# Patient Record
Sex: Male | Born: 1990 | Race: Black or African American | Hispanic: No | Marital: Single | Smoking: Current every day smoker
Health system: Southern US, Community
[De-identification: ages and names within clinical notes are randomized; demographics above are authoritative.]

---

## 2015-08-11 ENCOUNTER — Encounter (HOSPITAL_BASED_OUTPATIENT_CLINIC_OR_DEPARTMENT_OTHER): Payer: Self-pay

## 2015-08-11 ENCOUNTER — Emergency Department (HOSPITAL_BASED_OUTPATIENT_CLINIC_OR_DEPARTMENT_OTHER): Payer: No Typology Code available for payment source

## 2015-08-11 ENCOUNTER — Emergency Department (HOSPITAL_BASED_OUTPATIENT_CLINIC_OR_DEPARTMENT_OTHER)
Admission: EM | Admit: 2015-08-11 | Discharge: 2015-08-11 | Disposition: A | Discharge status: Home / Self Care | Payer: No Typology Code available for payment source | Attending: Emergency Medicine | Admitting: Emergency Medicine

## 2015-08-11 DIAGNOSIS — S0990XA Unspecified injury of head, initial encounter: Secondary | ICD-10-CM | POA: Diagnosis present

## 2015-08-11 DIAGNOSIS — Y998 Other external cause status: Secondary | ICD-10-CM | POA: Diagnosis not present

## 2015-08-11 DIAGNOSIS — S80811A Abrasion, right lower leg, initial encounter: Secondary | ICD-10-CM

## 2015-08-11 DIAGNOSIS — S0091XA Abrasion of unspecified part of head, initial encounter: Secondary | ICD-10-CM

## 2015-08-11 DIAGNOSIS — S4992XA Unspecified injury of left shoulder and upper arm, initial encounter: Secondary | ICD-10-CM | POA: Diagnosis not present

## 2015-08-11 DIAGNOSIS — Y9389 Activity, other specified: Secondary | ICD-10-CM | POA: Diagnosis not present

## 2015-08-11 DIAGNOSIS — S199XXA Unspecified injury of neck, initial encounter: Secondary | ICD-10-CM | POA: Insufficient documentation

## 2015-08-11 DIAGNOSIS — S29002A Unspecified injury of muscle and tendon of back wall of thorax, initial encounter: Secondary | ICD-10-CM | POA: Diagnosis not present

## 2015-08-11 DIAGNOSIS — Z23 Encounter for immunization: Secondary | ICD-10-CM | POA: Diagnosis not present

## 2015-08-11 DIAGNOSIS — Y92410 Unspecified street and highway as the place of occurrence of the external cause: Secondary | ICD-10-CM | POA: Insufficient documentation

## 2015-08-11 DIAGNOSIS — S4991XA Unspecified injury of right shoulder and upper arm, initial encounter: Secondary | ICD-10-CM | POA: Diagnosis not present

## 2015-08-11 DIAGNOSIS — S0001XA Abrasion of scalp, initial encounter: Secondary | ICD-10-CM | POA: Insufficient documentation

## 2015-08-11 DIAGNOSIS — M542 Cervicalgia: Secondary | ICD-10-CM

## 2015-08-11 MED ORDER — OXYCODONE-ACETAMINOPHEN 5-325 MG PO TABS
2.0000 | ORAL_TABLET | Freq: Once | ORAL | Status: AC
  Administered 2015-08-11: 2 via ORAL
  Filled 2015-08-11: qty 2

## 2015-08-11 MED ORDER — TETANUS-DIPHTH-ACELL PERTUSSIS 5-2.5-18.5 LF-MCG/0.5 IM SUSP
0.5000 mL | Freq: Once | INTRAMUSCULAR | Status: AC
  Administered 2015-08-11: 0.5 mL via INTRAMUSCULAR
  Filled 2015-08-11: qty 0.5

## 2015-08-11 MED ORDER — ONDANSETRON 8 MG PO TBDP
8.0000 mg | ORAL_TABLET | Freq: Once | ORAL | Status: AC
  Administered 2015-08-11: 8 mg via ORAL
  Filled 2015-08-11: qty 1

## 2015-08-11 NOTE — ED Notes (Signed)
Patient stable and ambulatory.  Patient verbalizes understanding of discharge instructions. 
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
## 2015-08-11 NOTE — Discharge Instructions (Signed)
Abrasion An abrasion is a cut or scrape on the surface of your skin. An abrasion does not go through all of the layers of your skin. It is important to take good care of your abrasion to prevent infection. HOME CARE Medicines  Take or apply medicines only as told by your doctor.  If you were prescribed an antibiotic ointment, finish all of it even if you start to feel better. Wound Care  Clean the wound with mild soap and water 2-3 times per day or as told by your doctor. Pat your wound dry with a clean towel. Do not rub it.  There are many ways to close and cover a wound. Follow instructions from your doctor about:  How to take care of your wound.  When and how you should change your bandage (dressing).  When and how you should take off your dressing.  Check your wound every day for signs of infection. Watch for:  Redness, swelling, or pain.  Fluid, blood, or pus. General Instructions  Keep the dressing dry as told by your doctor. Do not take baths, swim, use a hot tub, or do anything that would put your wound underwater until your doctor says it is okay.  If there is swelling, raise (elevate) the injured area above the level of your heart while you are sitting or lying down.  Keep all follow-up visits as told by your doctor. This is important. GET HELP IF:  You were given a tetanus shot and you have any of these where the needle went in:  Swelling.  Very bad pain.  Redness.  Bleeding.  Medicine does not help your pain.  You have any of these at the site of the wound:  More redness.  More swelling.  More pain. GET HELP RIGHT AWAY IF:  You have a red streak going away from your wound.  You have a fever.  You have fluid, blood, or pus coming from your wound.  There is a bad smell coming from your wound.   This information is not intended to replace advice given to you by your health care provider. Make sure you discuss any questions you have with your  health care provider.   Document Released: 03/17/2008 Document Revised: 02/13/2015 Document Reviewed: 09/27/2014 Elsevier Interactive Patient Education 2016 [employer] Tourist information centre managerMotor Vehicle Collision It is common to have multiple bruises and sore muscles after a motor vehicle collision (MVC). These tend to feel worse for the first 24 hours. You may have the most stiffness and soreness over the first several hours. You may also feel worse when you wake up the first morning after your collision. After this point, you will usually begin to improve with each day. The speed of improvement often depends on the severity of the collision, the number of injuries, and the location and nature of these injuries. HOME CARE INSTRUCTIONS  Put ice on the injured area.  Put ice in a plastic bag.  Place a towel between your skin and the bag.  Leave the ice on for 15-20 minutes, 3-4 times a day, or as directed by your health care provider.  Drink enough fluids to keep your urine clear or pale yellow. Do not drink alcohol.  Take a warm shower or bath once or twice a day. This will increase blood flow to sore muscles.  You may return to activities as directed by your caregiver. Be careful when lifting, as this may aggravate neck or back pain.  Only take over-the-counter  or prescription medicines for pain, discomfort, or fever as directed by your caregiver. Do not use aspirin. This may increase bruising and bleeding. SEEK IMMEDIATE MEDICAL CARE IF:  You have numbness, tingling, or weakness in the arms or legs.  You develop severe headaches not relieved with medicine.  You have severe neck pain, especially tenderness in the middle of the back of your neck.  You have changes in bowel or bladder control.  There is increasing pain in any area of the body.  You have shortness of breath, light-headedness, dizziness, or fainting.  You have chest pain.  You feel sick to your stomach (nauseous), throw up  (vomit), or sweat.  You have increasing abdominal discomfort.  There is blood in your urine, stool, or vomit.  You have pain in your shoulder (shoulder strap areas).  You feel your symptoms are getting worse. MAKE SURE YOU:  Understand these instructions.  Will watch your condition.  Will get help right away if you are not doing well or get worse.   This information is not intended to replace advice given to you by your health care provider. Make sure you discuss any questions you have with your health care provider.   Document Released: 09/29/2005 Document Revised: 10/20/2014 Document Reviewed: 02/26/2011 Elsevier Interactive Patient Education [employer]
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
## 2015-08-13 ENCOUNTER — Emergency Department (HOSPITAL_BASED_OUTPATIENT_CLINIC_OR_DEPARTMENT_OTHER)
Admission: EM | Admit: 2015-08-13 | Discharge: 2015-08-13 | Disposition: A | Discharge status: Home / Self Care | Payer: No Typology Code available for payment source | Attending: Emergency Medicine | Admitting: Emergency Medicine

## 2015-08-13 ENCOUNTER — Encounter (HOSPITAL_BASED_OUTPATIENT_CLINIC_OR_DEPARTMENT_OTHER): Payer: Self-pay

## 2015-08-13 ENCOUNTER — Emergency Department (HOSPITAL_BASED_OUTPATIENT_CLINIC_OR_DEPARTMENT_OTHER): Payer: No Typology Code available for payment source

## 2015-08-13 DIAGNOSIS — R110 Nausea: Secondary | ICD-10-CM | POA: Diagnosis not present

## 2015-08-13 DIAGNOSIS — Z720 Tobacco use: Secondary | ICD-10-CM | POA: Insufficient documentation

## 2015-08-13 DIAGNOSIS — S29001A Unspecified injury of muscle and tendon of front wall of thorax, initial encounter: Secondary | ICD-10-CM | POA: Diagnosis not present

## 2015-08-13 DIAGNOSIS — Y9389 Activity, other specified: Secondary | ICD-10-CM | POA: Diagnosis not present

## 2015-08-13 DIAGNOSIS — S8991XA Unspecified injury of right lower leg, initial encounter: Secondary | ICD-10-CM | POA: Insufficient documentation

## 2015-08-13 DIAGNOSIS — Y92410 Unspecified street and highway as the place of occurrence of the external cause: Secondary | ICD-10-CM | POA: Diagnosis not present

## 2015-08-13 DIAGNOSIS — S4990XA Unspecified injury of shoulder and upper arm, unspecified arm, initial encounter: Secondary | ICD-10-CM | POA: Insufficient documentation

## 2015-08-13 DIAGNOSIS — S3992XA Unspecified injury of lower back, initial encounter: Secondary | ICD-10-CM | POA: Insufficient documentation

## 2015-08-13 DIAGNOSIS — S0990XA Unspecified injury of head, initial encounter: Secondary | ICD-10-CM | POA: Diagnosis not present

## 2015-08-13 DIAGNOSIS — Y998 Other external cause status: Secondary | ICD-10-CM | POA: Insufficient documentation

## 2015-08-13 DIAGNOSIS — S3991XA Unspecified injury of abdomen, initial encounter: Secondary | ICD-10-CM | POA: Insufficient documentation

## 2015-08-13 DIAGNOSIS — S29002A Unspecified injury of muscle and tendon of back wall of thorax, initial encounter: Secondary | ICD-10-CM | POA: Insufficient documentation

## 2015-08-13 DIAGNOSIS — R0781 Pleurodynia: Secondary | ICD-10-CM

## 2015-08-13 MED ORDER — ONDANSETRON 4 MG PO TBDP: 4.0000 mg | ORAL_TABLET | Freq: Three times a day (TID) | ORAL | Status: AC | PRN

## 2015-08-13 MED ORDER — ONDANSETRON 4 MG PO TBDP
4.0000 mg | ORAL_TABLET | Freq: Once | ORAL | Status: AC
  Administered 2015-08-13: 4 mg via ORAL
  Filled 2015-08-13: qty 1

## 2015-08-13 MED ORDER — IBUPROFEN 800 MG PO TABS: 800.0000 mg | ORAL_TABLET | Freq: Three times a day (TID) | ORAL | Status: AC

## 2015-08-13 MED ORDER — IBUPROFEN 800 MG PO TABS
800.0000 mg | ORAL_TABLET | Freq: Once | ORAL | Status: AC
  Administered 2015-08-13: 800 mg via ORAL
  Filled 2015-08-13: qty 1
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
